# Patient Record
Sex: Male | Born: 1980 | Race: Black or African American | Hispanic: No | Marital: Married | Smoking: Never smoker
Health system: Southern US, Community
[De-identification: ages and names within clinical notes are randomized; demographics above are authoritative.]

---

## 2000-04-16 ENCOUNTER — Emergency Department (HOSPITAL_COMMUNITY): Admission: EM | Admit: 2000-04-16 | Discharge: 2000-04-16 | Payer: Self-pay | Admitting: Emergency Medicine

## 2000-04-16 ENCOUNTER — Encounter: Payer: Self-pay | Admitting: Emergency Medicine

## 2004-12-26 ENCOUNTER — Inpatient Hospital Stay (HOSPITAL_COMMUNITY): Admission: AC | Admit: 2004-12-26 | Discharge: 2004-12-29 | Payer: Self-pay

## 2011-12-22 ENCOUNTER — Ambulatory Visit
Admission: RE | Admit: 2011-12-22 | Discharge: 2011-12-22 | Disposition: A | Discharge status: Home / Self Care | Payer: No Typology Code available for payment source | Source: Ambulatory Visit | Attending: Occupational Medicine | Admitting: Occupational Medicine

## 2011-12-22 ENCOUNTER — Other Ambulatory Visit: Payer: Self-pay | Admitting: Occupational Medicine

## 2011-12-22 DIAGNOSIS — Z021 Encounter for pre-employment examination: Secondary | ICD-10-CM

## 2014-02-13 ENCOUNTER — Ambulatory Visit: Payer: 59 | Admitting: Emergency Medicine

## 2014-02-13 VITALS — BP 144/92 | HR 84 | Temp 98.4°F | Resp 17 | Ht 70.5 in | Wt 218.0 lb

## 2014-02-13 DIAGNOSIS — J029 Acute pharyngitis, unspecified: Secondary | ICD-10-CM

## 2014-02-13 LAB — POCT RAPID STREP A (OFFICE): Rapid Strep A Screen: NEGATIVE

## 2014-02-13 LAB — POCT INFLUENZA A/B
Influenza A, POC: NEGATIVE
Influenza B, POC: NEGATIVE

## 2014-02-13 MED ORDER — AMOXICILLIN 875 MG PO TABS: 875.0000 mg | ORAL_TABLET | Freq: Two times a day (BID) | ORAL | Status: DC

## 2014-02-13 NOTE — Progress Notes (Signed)
   Subjective:    Patient ID: Henry Morgan, male    DOB: 04/07/1981, 33 y.o.   MRN: [id number]  HPI Pt presents with a few days ago feeling chilled, body aches, and sore throat. Said he took his temperature and it was 100. 1. Pt states that son had strep throat. Not coughing. Just a little nasal congestion. Took some otc meds and said it helped a little.     Review of Systems     Objective:   Physical Exam TMs are clear. Tonsils are 3+ with exudate on the left there is a tender 1 x 1 cm right anterior cervical node chest is clear to auscultation percussion .    Results for orders placed in visit on 02/13/14  POCT RAPID STREP A (OFFICE)      Result Value Ref Range   Rapid Strep A Screen Negative  Negative   Results for orders placed in visit on 02/13/14  POCT RAPID STREP A (OFFICE)      Result Value Ref Range   Rapid Strep A Screen Negative  Negative      Assessment & Plan:  Strep culture done. Son definitely tested positive for strep we'll treat with amoxicillin twice a day for 10 days pending culture result

## 2014-02-13 NOTE — Patient Instructions (Signed)
Sore Throat A sore throat is pain, burning, irritation, or scratchiness of the throat. There is often pain or tenderness when swallowing or talking. A sore throat may be accompanied by other symptoms, such as coughing, sneezing, fever, and swollen neck glands. A sore throat is often the first sign of another sickness, such as a cold, flu, strep throat, or mononucleosis (commonly known as mono). Most sore throats go away without medical treatment. CAUSES  The most common causes of a sore throat include:  A viral infection, such as a cold, flu, or mono.  A bacterial infection, such as strep throat, tonsillitis, or whooping cough.  Seasonal allergies.  Dryness in the air.  Irritants, such as smoke or pollution.  Gastroesophageal reflux disease (GERD). HOME CARE INSTRUCTIONS   Only take over-the-counter medicines as directed by your caregiver.  Drink enough fluids to keep your urine clear or pale yellow.  Rest as needed.  Try using throat sprays, lozenges, or sucking on hard candy to ease any pain (if older than 4 years or as directed).  Sip warm liquids, such as broth, herbal tea, or warm water with honey to relieve pain temporarily. You may also eat or drink cold or frozen liquids such as frozen ice pops.  Gargle with salt water (mix 1 tsp salt with 8 oz of water).  Do not smoke and avoid secondhand smoke.  Put a cool-mist humidifier in your bedroom at night to moisten the air. You can also turn on a hot shower and sit in the bathroom with the door closed for 5 10 minutes. SEEK IMMEDIATE MEDICAL CARE IF:  You have difficulty breathing.  You are unable to swallow fluids, soft foods, or your saliva.  You have increased swelling in the throat.  Your sore throat does not get better in 7 days.  You have nausea and vomiting.  You have a fever or persistent symptoms for more than 2 3 days.  You have a fever and your symptoms suddenly get worse. MAKE SURE YOU:   Understand  these instructions.  Will watch your condition.  Will get help right away if you are not doing well or get worse. Document Released: 01/06/2005 Document Revised: 11/15/2012 Document Reviewed: 08/06/2012 [employer] Patient Information 2014 [employer].  

## 2014-02-15 LAB — CULTURE, GROUP A STREP

## 2021-06-05 ENCOUNTER — Ambulatory Visit
Admission: RE | Admit: 2021-06-05 | Discharge: 2021-06-05 | Disposition: A | Discharge status: Home / Self Care | Payer: No Typology Code available for payment source | Source: Ambulatory Visit | Attending: Nurse Practitioner | Admitting: Nurse Practitioner

## 2021-06-05 ENCOUNTER — Other Ambulatory Visit: Payer: Self-pay | Admitting: Nurse Practitioner

## 2021-06-05 DIAGNOSIS — Z021 Encounter for pre-employment examination: Secondary | ICD-10-CM

## 2021-07-06 ENCOUNTER — Other Ambulatory Visit: Payer: Self-pay

## 2021-07-06 ENCOUNTER — Ambulatory Visit (INDEPENDENT_AMBULATORY_CARE_PROVIDER_SITE_OTHER): Payer: 59 | Admitting: Registered Nurse

## 2021-07-06 ENCOUNTER — Encounter: Payer: Self-pay | Admitting: Registered Nurse

## 2021-07-06 VITALS — BP 137/85 | HR 68 | Temp 98.2°F | Resp 18 | Ht 70.5 in | Wt 224.2 lb

## 2021-07-06 DIAGNOSIS — Z7689 Persons encountering health services in other specified circumstances: Secondary | ICD-10-CM

## 2021-07-06 DIAGNOSIS — R030 Elevated blood-pressure reading, without diagnosis of hypertension: Secondary | ICD-10-CM

## 2021-07-06 MED ORDER — AMLODIPINE BESYLATE 5 MG PO TABS: 5.0000 mg | ORAL_TABLET | Freq: Every day | ORAL | 1 refills | Status: AC

## 2021-07-06 NOTE — Patient Instructions (Addendum)
Mr. Henry Morgan to meet you  Your BP is almost perfect today - starting a low dose of amlodipine 5mg  daily can help get it down where we want it to stay long term. If you keep up with the running, there's a good chance we can see it fall enough to ditch the amlodipine.   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   See you in 6 mo, sooner if you need anything  Don't forget to drop off copies of labs and immunization records.  Thank you  [name]     If you have lab work done today you will be contacted with your lab results within the next 2 weeks.  If you have not heard from then please contact us. The fastest way to get your results is to register for My Chart.   IF you received an x-ray today, you will receive an invoice from [facility]. Please contact [facility] at [phone] with questions or concerns regarding your invoice.   IF you received labwork today, you will receive an invoice from [facility]. Please contact [facility] at [phone] with questions or concerns regarding your invoice.   Our billing staff will not be able to assist you with questions regarding bills from these companies.  You will be contacted with the lab results as soon as they are available. The fastest way to get your results is to activate your My Chart account. Instructions are located on the last page of this paperwork. If you have not heard from [phone] regarding the results in 2 weeks, please contact this office.

## 2021-07-06 NOTE — Progress Notes (Signed)
New Patient Office Visit  Subjective:  Patient ID: Henry Morgan, male    DOB: 12/25/1980  Age: 40 y.o. 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 Patient states he will not be cleared until the blood pressure is under control.    HPI Derren Suydam presents for visit to est care.  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 Had physical with them recently. Noted elevated BP at that time. Needed it rechecked in primary care office. He did have lab work as well, will drop off a copy at our office.   Former Teacher, early years/pre. Mostly out of [place]. Has vaccination records available, will drop off at our office at his convenience.   History reviewed. No pertinent past medical history.  History reviewed. No pertinent surgical history.  Family History  Problem Relation Age of Onset   Hypertension Father     Social History   Socioeconomic History   Marital status: Married    Spouse name: Not on file   Number of children: Not on file   Years of education: Not on file   Highest education level: Not on file  Occupational History   Not on file  Tobacco Use   Smoking status: Never   Smokeless tobacco: Never  Vaping Use   Vaping Use: Never used  Substance and Sexual Activity   Alcohol use: Yes    Alcohol/week: 4.0 standard drinks    Types: 4 Cans of beer per week   Drug use: Never   Sexual activity: Yes  Other Topics Concern   Not on file  Social History Narrative   Not on file   Social Determinants of Health   Financial Resource Strain: Not on file  Food Insecurity: Not on file  Transportation Needs: Not on file  Physical Activity: Not on file  Stress: Not on file  Social Connections: Not on file  Intimate Partner Violence: Not on file    ROS Review of Systems  Constitutional: Negative.   HENT: Negative.    Eyes:  Negative.   Respiratory: Negative.    Cardiovascular: Negative.   Gastrointestinal: Negative.   Genitourinary: Negative.   Musculoskeletal: Negative.   Skin: Negative.   Neurological: Negative.   Psychiatric/Behavioral: Negative.    All other systems reviewed and are negative.  Objective:   Today's Vitals: BP 137/85   Pulse 68   Temp 98.2 F (36.8 C) (Temporal)   Resp 18   Ht 5' 10.5" (1.791 m)   Wt 224 lb 3.2 oz (101.7 kg)   SpO2 99%   BMI 31.71 kg/m   Physical Exam Vitals and nursing note reviewed.  Constitutional:      Appearance: Normal appearance.  Cardiovascular:     Rate and Rhythm: Normal rate and regular rhythm.     Pulses: Normal pulses.     Heart sounds: Normal heart sounds. No murmur heard.   No friction rub. No gallop.  Pulmonary:     Effort: Pulmonary effort is normal. No respiratory distress.     Breath sounds: Normal breath sounds. No stridor. No wheezing, rhonchi or rales.  Neurological:     General: No focal deficit present.     Mental Status: He is alert. Mental status is at baseline.  Psychiatric:        Mood and Affect: Mood normal.  Behavior: Behavior normal.        Thought Content: Thought content normal.        Judgment: Judgment normal.    Assessment & Plan:   Problem List Items Addressed This Visit   None   Outpatient Encounter Medications as of 07/06/2021  Medication Sig   [DISCONTINUED] amoxicillin (AMOXIL) 875 MG tablet Take 1 tablet (875 mg total) by mouth 2 (two) times daily. (Patient not taking: Reported on 07/06/2021)   No facility-administered encounter medications on file as of 07/06/2021.    Follow-up: No follow-ups on file.   PLAN BP mildly elevated today. Discussed ideal readings of below 130/80. Will start low dose amlodipine at 5mg  PO qd for BP control.  Follow up in 6 mo Patient encouraged to call clinic with any questions, comments, or concerns.   , NP

## 2022-10-28 IMAGING — CR DG CHEST 1V
1 series · 1 of 1 positions shown · non-contrast
Comparison: 12/22/2011

CLINICAL DATA: Pre-employment evaluation

EXAM:
CHEST  1 VIEW

[w chest pa]
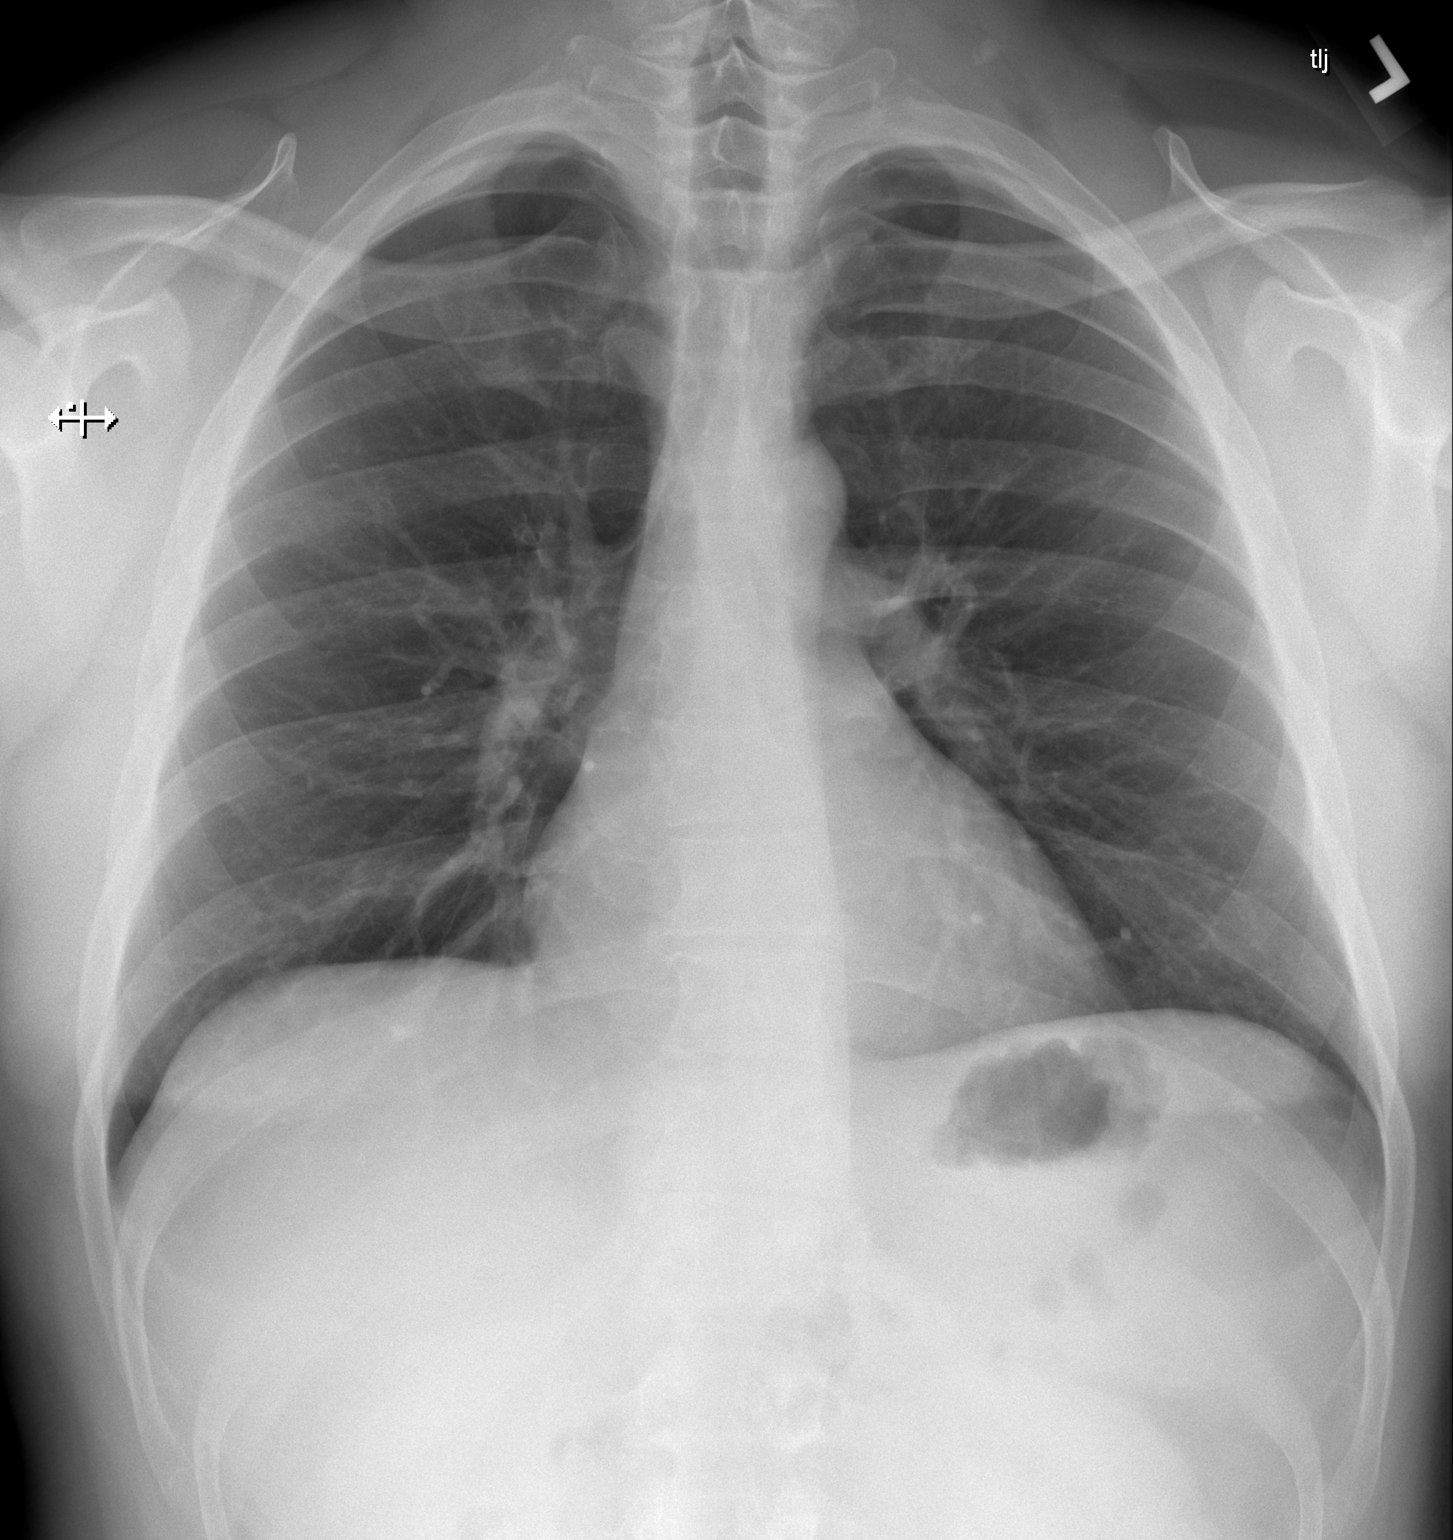

[1 of 1 positions shown; findings below may reference images not displayed]

FINDINGS: The heart size and mediastinal contours are within normal limits.
Both lungs are clear. The visualized skeletal structures are
unremarkable.
IMPRESSION: No active disease.

## 2024-07-13 ENCOUNTER — Other Ambulatory Visit (HOSPITAL_BASED_OUTPATIENT_CLINIC_OR_DEPARTMENT_OTHER): Payer: Self-pay | Admitting: Family Medicine

## 2024-07-13 DIAGNOSIS — Z8249 Family history of ischemic heart disease and other diseases of the circulatory system: Secondary | ICD-10-CM

## 2024-08-27 ENCOUNTER — Ambulatory Visit (HOSPITAL_BASED_OUTPATIENT_CLINIC_OR_DEPARTMENT_OTHER)
Admission: RE | Admit: 2024-08-27 | Discharge: 2024-08-27 | Disposition: A | Discharge status: Home / Self Care | Payer: Self-pay | Source: Ambulatory Visit | Attending: Family Medicine | Admitting: Family Medicine

## 2024-08-27 DIAGNOSIS — Z8249 Family history of ischemic heart disease and other diseases of the circulatory system: Secondary | ICD-10-CM
